# Patient Record
Sex: Female | Born: 1960 | Race: White | Hispanic: No | Marital: Married | Smoking: Never smoker
Health system: Southern US, Community
[De-identification: ages and names within clinical notes are randomized; demographics above are authoritative.]

## PROBLEM LIST (undated history)

## (undated) DIAGNOSIS — J45909 Unspecified asthma, uncomplicated: Secondary | ICD-10-CM

## (undated) DIAGNOSIS — E559 Vitamin D deficiency, unspecified: Secondary | ICD-10-CM

## (undated) DIAGNOSIS — J449 Chronic obstructive pulmonary disease, unspecified: Secondary | ICD-10-CM

## (undated) DIAGNOSIS — C50919 Malignant neoplasm of unspecified site of unspecified female breast: Secondary | ICD-10-CM

## (undated) HISTORY — DX: Malignant neoplasm of unspecified site of unspecified female breast: C50.919

## (undated) HISTORY — DX: Vitamin D deficiency, unspecified: E55.9

## (undated) HISTORY — DX: Chronic obstructive pulmonary disease, unspecified: J44.9

---

## 2003-11-13 ENCOUNTER — Inpatient Hospital Stay (HOSPITAL_COMMUNITY): Admission: EM | Admit: 2003-11-13 | Discharge: 2003-11-14 | Payer: Self-pay | Admitting: Emergency Medicine

## 2004-04-26 ENCOUNTER — Ambulatory Visit: Payer: Self-pay | Admitting: Internal Medicine

## 2004-07-18 ENCOUNTER — Ambulatory Visit: Payer: Self-pay | Admitting: Internal Medicine

## 2004-08-02 ENCOUNTER — Ambulatory Visit: Payer: Self-pay | Admitting: Internal Medicine

## 2004-08-17 ENCOUNTER — Ambulatory Visit: Payer: Self-pay | Admitting: Internal Medicine

## 2004-08-24 ENCOUNTER — Ambulatory Visit: Payer: Self-pay

## 2004-09-20 ENCOUNTER — Ambulatory Visit: Payer: Self-pay | Admitting: Internal Medicine

## 2004-09-23 ENCOUNTER — Ambulatory Visit: Payer: Self-pay | Admitting: Internal Medicine

## 2005-01-03 HISTORY — PX: ABDOMINAL HYSTERECTOMY: SHX81

## 2005-01-20 ENCOUNTER — Ambulatory Visit: Payer: Self-pay | Admitting: Internal Medicine

## 2005-01-31 ENCOUNTER — Inpatient Hospital Stay (HOSPITAL_COMMUNITY): Admission: RE | Admit: 2005-01-31 | Discharge: 2005-02-02 | Payer: Self-pay | Admitting: Obstetrics and Gynecology

## 2005-05-08 ENCOUNTER — Ambulatory Visit: Payer: Self-pay | Admitting: Internal Medicine

## 2006-06-05 DIAGNOSIS — C50919 Malignant neoplasm of unspecified site of unspecified female breast: Secondary | ICD-10-CM

## 2006-06-05 HISTORY — DX: Malignant neoplasm of unspecified site of unspecified female breast: C50.919

## 2008-11-03 HISTORY — PX: LAPAROSCOPIC GASTRIC BANDING: SHX1100

## 2018-02-22 ENCOUNTER — Telehealth: Payer: Self-pay | Admitting: Oncology

## 2018-02-22 ENCOUNTER — Encounter: Payer: Self-pay | Admitting: Oncology

## 2018-02-22 NOTE — Telephone Encounter (Signed)
Received the pt's records from [facility] to transfer her care for hx of breas cancer. Pt has been cld and scheduled to see Dr. Jana Hakim on 10/21 at 4pm. Pt aware to arrive 30 minutes early. Letter mailed.

## 2018-03-21 ENCOUNTER — Other Ambulatory Visit: Payer: Self-pay | Admitting: General Surgery

## 2018-03-21 DIAGNOSIS — R1110 Vomiting, unspecified: Secondary | ICD-10-CM

## 2018-03-25 ENCOUNTER — Other Ambulatory Visit: Payer: Self-pay | Admitting: *Deleted

## 2018-03-25 ENCOUNTER — Ambulatory Visit
Admission: RE | Admit: 2018-03-25 | Discharge: 2018-03-25 | Disposition: A | Discharge status: Home / Self Care | Payer: BLUE CROSS/BLUE SHIELD | Source: Ambulatory Visit | Attending: General Surgery | Admitting: General Surgery

## 2018-03-25 ENCOUNTER — Encounter: Payer: Self-pay | Admitting: Oncology

## 2018-03-25 ENCOUNTER — Inpatient Hospital Stay: Payer: BLUE CROSS/BLUE SHIELD | Attending: Oncology | Admitting: Oncology

## 2018-03-25 DIAGNOSIS — H04202 Unspecified epiphora, left lacrimal gland: Secondary | ICD-10-CM | POA: Insufficient documentation

## 2018-03-25 DIAGNOSIS — Z170 Estrogen receptor positive status [ER+]: Secondary | ICD-10-CM

## 2018-03-25 DIAGNOSIS — Z86000 Personal history of in-situ neoplasm of breast: Secondary | ICD-10-CM | POA: Insufficient documentation

## 2018-03-25 DIAGNOSIS — Z808 Family history of malignant neoplasm of other organs or systems: Secondary | ICD-10-CM | POA: Insufficient documentation

## 2018-03-25 DIAGNOSIS — R1110 Vomiting, unspecified: Secondary | ICD-10-CM

## 2018-03-25 DIAGNOSIS — C4492 Squamous cell carcinoma of skin, unspecified: Secondary | ICD-10-CM

## 2018-03-25 DIAGNOSIS — R203 Hyperesthesia: Secondary | ICD-10-CM

## 2018-03-25 DIAGNOSIS — L989 Disorder of the skin and subcutaneous tissue, unspecified: Secondary | ICD-10-CM | POA: Insufficient documentation

## 2018-03-25 DIAGNOSIS — Z853 Personal history of malignant neoplasm of breast: Secondary | ICD-10-CM | POA: Insufficient documentation

## 2018-03-25 DIAGNOSIS — H04212 Epiphora due to excess lacrimation, left lacrimal gland: Secondary | ICD-10-CM | POA: Diagnosis not present

## 2018-03-25 DIAGNOSIS — C50811 Malignant neoplasm of overlapping sites of right female breast: Secondary | ICD-10-CM

## 2018-03-25 NOTE — Progress Notes (Signed)
[facility]  [phone] [phone]) [phone]     ID: Gwendolyn Taylor DOB: 02/19/1961  MR#: [id number]  QBH#:[id number]  Patient Care Team: Patient, No Pcp Per as PCP - General ([place]) Gwendolyn Taylor as Consulting Physician (Oncology) OTHER Taylor:  CHIEF COMPLAINT: Estrogen receptor positive breast cancer  CURRENT TREATMENT: Observation  HISTORY OF CURRENT ILLNESS:  The patient had screening breast mammography performed 12-08-2006, showing some suspicious areas in the right breast.  She was set up for core biopsy 11/28/2006.  The pathology [id number]) showed mostly intraductal carcinoma, with comedonecrosis, and an adjacent focus of infiltrating ductal carcinoma.  On 12/06/2006 the patient underwent lumpectomy and sentinel lymph node biopsy.  The pathology from this procedure ([id number]) showed residual intraductal carcinoma, DCIS, with minute foci of infiltrating ductal carcinoma.  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  The margins were clear.  The residual mass was measured at 0.5 cm.  The patient is reportedly estrogen progesterone and HER-2 positive.  She was then treated with dose dense doxorubicin and cyclophosphamide x4, completed 04/20/2007, and received adjuvant radiation completed in January 2009.  Adjuvantly the patient was started on anastrozole, which she did not tolerate because of arthralgias and myalgias, and then switched to letrozole, which she did not tolerate because of again arthralgias and myalgias as well as hot flashes.  She was offered (at the 01/14/2009 visit) tamoxifen or raloxifene, but refused any further antiestrogens as they made her feel very sick. She has been under observation since that time.   The patient's subsequent history is as detailed below.  INTERVAL HISTORY: Gwendolyn Taylor was evaluated in the breast cancer clinic on 03/25/2018. She is here to establish care since moving to [facility] in December 2018.  Her last mammogram was  performed on 08/18/16 at [facility], showing no suspicious mass.   REVIEW OF SYSTEMS: There were no specific symptoms leading to the original mammogram, which was routinely scheduled. She denies fevers, drenching sweats, fatigue. She reports a new spot to her chest that is pink for the last two weeks. She is currently out of vitamin D. The patient denies unusual headaches, visual changes, nausea, vomiting, stiff neck, dizziness, or gait imbalance. There has been no cough, phlegm production, or pleurisy, no chest pain or pressure, and no change in bowel or bladder habits. The patient denies fever, rash, bleeding, unexplained fatigue or unexplained weight loss. A detailed review of systems was otherwise entirely negative.   PAST MEDICAL HISTORY: Past Medical History:  Diagnosis Date  . Breast cancer ([place]) 2008   intraductal carcinoma, DCIS, HER-2 positive  . COPD (chronic obstructive pulmonary disease) ([place])   . Vitamin D deficiency   History of diabetes and HTN-- resolved with lab band placement.  PAST SURGICAL HISTORY: Past Surgical History:  Procedure Laterality Date  . ABDOMINAL HYSTERECTOMY  01/2005   no BSO  . CESAREAN SECTION  1997  . LAPAROSCOPIC GASTRIC BANDING  12-07-08      FAMILY HISTORY Family History  Problem Relation Age of Onset  . Melanoma Mother 21       removal in her 66s  . Alzheimer's disease Mother   . Diabetes Mother   . Heart disease Mother   . Alcohol abuse Father   . Ovarian cancer Neg Hx   . Breast cancer Neg Hx     She notes that her mother died from Alzheimer's, heart disease, and diabetes in 12/07/17. Patients' father died from suicide with drug and alcohol abuse  at age 67. Her parents divorced when she was 53 y.o. The patient has 0 brothers and 1 sister. Patient denies anyone in her family having ovarian or breast cancer. Her mother had melanoma removed in her 27s. Her first husband died from colon cancer.   GYNECOLOGIC HISTORY:  No LMP  recorded. Menarche: 57 years old Age at first live birth: 57 years old [place] P 1 LMP 2005 Contraceptive: yes, no problems HRT none  Hysterectomy? Yes, partial (ovaries in situ), 2006 She took infertility medication  At the time of her first marriage (son is from 2d marriage)   SOCIAL HISTORY: [place]'s from [place] and moved back to [place] in November 2018. She is a Insurance underwriter. Her husband, Liliane Channel, is a catholic priest who works at a funeral home. Son Edison Nasuti, age 64, studies music at [employer] and hopes to go to [place] following undergraduate. He is also minoring in business.They currently attend [employer], where her husband plays the organ.      ADVANCED DIRECTIVES: n/a   HEALTH MAINTENANCE: Social History   Tobacco Use  . Smoking status: Not on file  Substance Use Topics  . Alcohol use: Not on file  . Drug use: Not on file     Colonoscopy: 08/03/2016  PAP: 07/28/2014  Bone density: 07/29/2014   No Known Allergies  Current Outpatient Medications  Medication Sig Dispense Refill  . albuterol (PROVENTIL HFA;VENTOLIN HFA) 108 (90 Base) MCG/ACT inhaler Inhale into the lungs every 6 (six) hours as needed for wheezing or shortness of breath.    . loratadine (CLARITIN) 10 MG tablet Claritin 10 mg tablet  Take 1 tablet every day by oral route.     No current facility-administered medications for this visit.     OBJECTIVE: Middle-aged white woman in no acute distress  Vitals:   03/25/18 1554  BP: (!) 140/95  Pulse: 83  Resp: 19  Temp: 98.5 F (36.9 C)  SpO2: 99%     Body mass index is 31.73 kg/m.   Wt Readings from Last 3 Encounters:  03/25/18 179 lb 1.6 oz (81.2 kg)      ECOG FS:1 - Symptomatic but completely ambulatory  Ocular: Sclerae unicteric, pupils round and equal; no apparent epiphora on left Ear-nose-throat: Oropharynx clear and moist Lymphatic: No cervical or supraclavicular adenopathy Lungs no  rales or rhonchi Heart regular rate and rhythm Abd soft, nontender, positive bowel sounds MSK no focal spinal tenderness, no joint edema Neuro: non-focal, well-oriented, appropriate affect Breasts: The right breast exhibits significant hyperesthesia, without erythema.  It is difficult to examine and even a minimal touch is extremely uncomfortable for the patient and "feels weird".  However I did examine the breast and there are no palpable masses.  The left breast is unremarkable.  Both axillae are benign. Skin: The area of concern in the middle anterior chest is a slightly scaly lesion measuring approximately 2 mm, not erythematous or tender.  The other lesion that she dislikes in the right lower quadrant is a seborrheic keratosis   LAB RESULTS:  CMP  No results found for: NA, K, CL, CO2, GLUCOSE, BUN, CREATININE, CALCIUM, PROT, ALBUMIN, AST, ALT, ALKPHOS, BILITOT, GFRNONAA, GFRAA  No results found for: TOTALPROTELP, ALBUMINELP, A1GS, A2GS, BETS, BETA2SER, GAMS, MSPIKE, SPEI  No results found for: KPAFRELGTCHN, LAMBDASER, KAPLAMBRATIO  No results found for: WBC, NEUTROABS, HGB, HCT, MCV, PLT  _0 @  No results found for: LABCA2  No components  found for: BZJIRC789  No results for input(s): INR in the last 168 hours.  No results found for: LABCA2  No results found for: FYB017  No results found for: PZW258  No results found for: NID782  No results found for: CA2729  No components found for: HGQUANT  No results found for: CEA1 / No results found for: CEA1   No results found for: AFPTUMOR  No results found for: CHROMOGRNA  No results found for: PSA1  No visits with results within 3 Day(s) from this visit.  Latest known visit with results is:  No results found for any previous visit.    (this displays the last labs from the last 3 days)  No results found for: TOTALPROTELP, ALBUMINELP, A1GS, A2GS, BETS, BETA2SER, GAMS, MSPIKE, SPEI (this displays SPEP  labs)  No results found for: KPAFRELGTCHN, LAMBDASER, KAPLAMBRATIO (kappa/lambda light chains)  No results found for: HGBA, HGBA2QUANT, HGBFQUANT, HGBSQUAN (Hemoglobinopathy evaluation)   No results found for: LDH  No results found for: IRON, TIBC, IRONPCTSAT (Iron and TIBC)  No results found for: FERRITIN  Urinalysis No results found for: COLORURINE, APPEARANCEUR, LABSPEC, PHURINE, GLUCOSEU, HGBUR, BILIRUBINUR, KETONESUR, PROTEINUR, UROBILINOGEN, NITRITE, LEUKOCYTESUR   STUDIES: Outside studies available today include bilateral screening 3D mammography with tomography at [facility] 08/18/2016 showing no suspicious mass, scattered fibroglandular densities.  Bone scan 12/26/2010 showed no evidence of metastatic disease.  There was arthritis involving the shoulders wrists hips knees and ankles as well as degenerative disease of the spine.  On 08/07/2007 she had CT scans of the chest abdomen and pelvis with contrast showing pleural and parenchymal scarring in the right mid lungs, related to radiation changes; there were small bilateral axillary lymph nodes, a 0.2 cm right lower lobe nodular density, a few small periaortic lymph nodes, a 0.2 cm lytic lesion in the left pelvis felt to be an ovarian cyst and essentially no change from prior study 04/17/2007.  ELIGIBLE FOR AVAILABLE RESEARCH PROTOCOL: no  ASSESSMENT: 57 y.o. [place], [place] woman status post right breast overlapping biopsy June 2008 for an invasive ductal carcinoma, grade not stated, estrogen and progesterone receptor positive, HER-2 amplified.  (1) status post left lumpectomy and sentinel lymph node sampling for a pT1a pN0, stage IA invasive ductal carcinoma, with negative margins  (2) treated adjuvantly with cyclophosphamide and doxorubicin in dose dense fashion x4, completed 04/20/2007  (3) status post adjuvant radiation completed January 2009  (4) antiestrogen therapy:  (a) did not tolerate anastrozole or  letrozole; received less of 1 year of aromatase inhibitors  (b) decided against tamoxifen or raloxifene or any other antiestrogens as of 01/14/2009  PLAN: I spent approximately 50 minutes face to face with Kiri with more than 50% of that time spent in counseling and coordination of care. Specifically we discussed her case as if it had just been diagnosed so that she could see how we would have approached her situation in 2019 as opposed to 10 years ago.  We first reviewed the fact that cancer is not one disease but more than 100 different diseases and that it is important to keep them separate-- otherwise when friends and relatives discuss their own cancer experiences with Montie confusion can result. Similarly we explained that if breast cancer spreads to the bone or liver, the patient would not have bone cancer or liver cancer, but breast cancer in the bone and breast cancer in the liver: one cancer in three places-- not 3 different cancers which otherwise would have to  be treated in 3 different ways.  We discussed the difference between local and systemic therapy. In terms of loco-regional treatment, lumpectomy plus radiation is equivalent to mastectomy as far as survival is concerned. For this reason, and because the cosmetic results are generally superior, we recommend breast conserving surgery followed by radiation.  This is the treatment she had and I note that margins were clear at the time of surgery.  We then discussed the rationale for systemic therapy. There is some risk that this cancer may have already spread to other parts of her body. Patients frequently ask at this point about bone scans, CAT scans and PET scans to find out if they have occult breast cancer somewhere else. The problem is that in early stage disease we are much more likely to find false positives then true cancers and this would expose the patient to unnecessary procedures as well as unnecessary radiation.  In fact she  did have scans initially which showed nonspecific adenopathy.  This was followed up by a PET scan and then by repeat series of CT scans.  10 years later she has had no "B symptoms" or any other suggestion of possible lymphoma.  Those scans do not need to be repeated in the absence of any specific symptoms to evaluate.Marland Kitchen  Next we went over the options for systemic therapy which are anti-estrogens, anti-HER-2 immunotherapy, and chemotherapy. [place] would be a candidate for all 3, except that currently NCCN guidelines do not recommend either chemotherapy or anti-HER-2 treatment for T1a disease.  Antiestrogens are to be "considered".  We discussed antiestrogens.  She has tried aromatase inhibitors and did not tolerate them.  She was offered tamoxifen or raloxifene but chose not to take them.  I reviewed with her that these are very different from aromatase inhibitors and she might well tolerate these without difficulty.    In short she is very likely cured of both the invasive and noninvasive component of her 2009 cancers.  The main difference between the treatment she received and today is that we would not have given her chemotherapy.  We reviewed the fact that the strange sensations in her right breast are not "in her head".  This is due to nerve damage from radiation and it is called hyperesthesia.  It is not likely to go away.  It may be that with different bras it might be a little bit more manageable and she will go to "second to nature" to explore that possibility.  The left epiphora is not due to adenopathy.  It is due to a blocked duct.  We are placing a referral to [facility] ophthalmology to see if they can help her manage that.  She has a family history of melanoma and a spot in the front chest that she would like evaluated as well as one in the right lower quadrant that she would like removed.  We have placed a referral to [facility] dermatology to see if they can see her on a yearly basis for  screening  Otherwise I think she is an excellent candidate for our survivorship program.  She is going to establish herself with a gynecologist and get a mammogram through that office.  I think that will be done before the end of the year.  She will see my nurse practitioner in survivorship April 2020.  I will be glad to see Judeen Hammans at any point in the future if and when the need arises but as of now are making no  routine appointments for her with me in the future After much discussion   Gwendolyn Taylor  03/25/18 5:03 PM Medical Oncology and Hematology [facility] [address] [place], [place] [address] Tel. [phone]    Fax. [phone]  IWilburn Mylar, am acting as scribe for Chauncey Cruel Taylor.  I, Lurline Del Taylor, have reviewed the above documentation for accuracy and completeness, and I agree with the above.

## 2018-03-26 ENCOUNTER — Encounter: Payer: Self-pay | Admitting: Oncology

## 2018-03-26 ENCOUNTER — Telehealth: Payer: Self-pay | Admitting: Oncology

## 2018-03-26 NOTE — Telephone Encounter (Signed)
Per 10/21 no los °

## 2018-03-27 ENCOUNTER — Telehealth: Payer: Self-pay | Admitting: Adult Health

## 2018-03-27 NOTE — Telephone Encounter (Signed)
Scheduled appt per 10/21 sch message SCP - sent reminder letter in the mail with f/u date and time.

## 2018-09-10 ENCOUNTER — Telehealth: Payer: Self-pay | Admitting: Adult Health

## 2018-09-10 NOTE — Telephone Encounter (Signed)
Rescheduled SCP for LTS per sch msg. Called patient. No answer, left msg, mailed printout.

## 2018-09-13 ENCOUNTER — Other Ambulatory Visit: Payer: Self-pay

## 2018-09-13 ENCOUNTER — Encounter (HOSPITAL_COMMUNITY): Payer: Self-pay | Admitting: Emergency Medicine

## 2018-09-13 ENCOUNTER — Emergency Department (HOSPITAL_COMMUNITY): Payer: BLUE CROSS/BLUE SHIELD

## 2018-09-13 ENCOUNTER — Emergency Department (HOSPITAL_COMMUNITY)
Admission: EM | Admit: 2018-09-13 | Discharge: 2018-09-13 | Disposition: A | Discharge status: Home / Self Care | Payer: BLUE CROSS/BLUE SHIELD | Attending: Emergency Medicine | Admitting: Emergency Medicine

## 2018-09-13 DIAGNOSIS — J45909 Unspecified asthma, uncomplicated: Secondary | ICD-10-CM | POA: Diagnosis not present

## 2018-09-13 DIAGNOSIS — Z85828 Personal history of other malignant neoplasm of skin: Secondary | ICD-10-CM | POA: Insufficient documentation

## 2018-09-13 DIAGNOSIS — Z853 Personal history of malignant neoplasm of breast: Secondary | ICD-10-CM | POA: Insufficient documentation

## 2018-09-13 DIAGNOSIS — Z79899 Other long term (current) drug therapy: Secondary | ICD-10-CM | POA: Insufficient documentation

## 2018-09-13 DIAGNOSIS — J449 Chronic obstructive pulmonary disease, unspecified: Secondary | ICD-10-CM | POA: Insufficient documentation

## 2018-09-13 DIAGNOSIS — M25562 Pain in left knee: Secondary | ICD-10-CM | POA: Diagnosis not present

## 2018-09-13 HISTORY — DX: Unspecified asthma, uncomplicated: J45.909

## 2018-09-13 NOTE — ED Triage Notes (Signed)
Getting out of shower last night and heard pop Now unable to walk on it   Swollen, blue

## 2018-09-13 NOTE — ED Provider Notes (Signed)
[facility] EMERGENCY DEPARTMENT Provider Note   CSN: [id number] Arrival date & time: 09/13/18  1312    History   Chief Complaint Chief Complaint  Patient presents with  . Knee Pain    L    HPI ODESSIA ASLESON is a 58 y.o. female.     Pop in left knee after getting out of shower last night.  No other injuries.  Patient is having trouble bearing weight and walking on her leg at this time.  Severity is moderate to severe.  Palpation position make pain worse.     Past Medical History:  Diagnosis Date  . Asthma   . Breast cancer ([place]) 2008   intraductal carcinoma, DCIS, HER-2 positive  . COPD (chronic obstructive pulmonary disease) ([place])   . Vitamin D deficiency     Patient Active Problem List   Diagnosis Date Noted  . Malignant neoplasm of overlapping sites of right breast in female, estrogen receptor positive ([place]) 03/25/2018  . 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 Squamous cell skin cancer 03/25/2018  . Hyperesthesia 03/25/2018    Past Surgical History:  Procedure Laterality Date  . ABDOMINAL HYSTERECTOMY  01/2005   no BSO  . CESAREAN SECTION  1997  . LAPAROSCOPIC GASTRIC BANDING  11/2008     OB History   No obstetric history on file.      Home Medications    Prior to Admission medications   Medication Sig Start Date End Date Taking? Authorizing Provider  albuterol (PROVENTIL HFA;VENTOLIN HFA) 108 (90 Base) MCG/ACT inhaler Inhale into the lungs every 6 (six) hours as needed for wheezing or shortness of breath.    [provider]  loratadine (CLARITIN) 10 MG tablet Claritin 10 mg tablet  Take 1 tablet every day by oral route.    [provider]    Family History Family History  Problem Relation Age of Onset  . Melanoma Mother 48       removal in her 53s  . Alzheimer's disease Mother   . Diabetes Mother   . Heart disease Mother   . Alcohol abuse Father   . Ovarian cancer Neg Hx   . Breast cancer Neg Hx      Social History Social History   Tobacco Use  . Smoking status: Never Smoker  . Smokeless tobacco: Never Used  Substance Use Topics  . Alcohol use: Not Currently  . Drug use: Never     Allergies   Patient has no known allergies.   Review of Systems Review of Systems  All other systems reviewed and are negative.    Physical Exam Updated Vital Signs BP 129/80   Pulse 84   Temp 98.1 F (36.7 C) (Oral)   Resp 16   Ht _0  (1.6 m)   Wt 85.7 kg   SpO2 100%   BMI 33.48 kg/m   Physical Exam Vitals signs and nursing note reviewed.  Constitutional:      Appearance: She is well-developed.  HENT:     Head: Normocephalic and atraumatic.  Eyes:     Conjunctiva/sclera: Conjunctivae normal.  Neck:     Musculoskeletal: Neck supple.  Pulmonary:     Effort: Pulmonary effort is normal.  Musculoskeletal:     Comments: Left knee: Edematous.  Pain with range of motion in all quadrants.  Skin:    General: Skin is warm and dry.  Neurological:     Mental Status: She is alert and oriented to person,  place, and time.  Psychiatric:        Behavior: Behavior normal.      ED Treatments / Results  Labs (all labs ordered are listed, but only abnormal results are displayed) Labs Reviewed - No data to display  EKG None  Radiology Dg Knee Complete 4 Views Left  Result Date: 09/13/2018 CLINICAL DATA:  Heard pop. EXAM: LEFT KNEE - COMPLETE 4+ VIEW COMPARISON:  None. FINDINGS: Degenerative changes in the left knee with joint space narrowing and spurring, most pronounced in the patellofemoral compartment. Small joint effusion. No acute bony abnormality. Specifically, no fracture, subluxation, or dislocation. IMPRESSION: Moderate osteoarthritis. Small joint effusion. No acute bony abnormality. Electronically Signed   By: Rolm Baptise M.D.   On: 09/13/2018 13:57    Procedures Procedures (including critical care time)  Medications Ordered in ED Medications - No data to display    Initial Impression / Assessment and Plan / ED Course  I have reviewed the triage vital signs and the nursing notes.  Pertinent labs & imaging results that were available during my care of the patient were reviewed by me and considered in my medical decision making (see chart for details).        Patient presents with pain in left knee acutely last night.  Plain films reveal osteoarthritis and a small effusion.  Suspect ligamentous strain versus meniscus tear.  RICE.  Refer to orthopedics.  Final Clinical Impressions(s) / ED Diagnoses   Final diagnoses:  Acute pain of left knee    ED Discharge Orders    None       Nat Christen, MD 09/13/18 1451

## 2018-09-13 NOTE — Discharge Instructions (Addendum)
Ice, elevate, knee immobilizer.  Over-the-counter pain medication.  Follow-up with orthopedic doctor.  Phone number given.

## 2018-09-26 ENCOUNTER — Encounter: Payer: BLUE CROSS/BLUE SHIELD | Admitting: Adult Health

## 2019-02-07 ENCOUNTER — Encounter: Payer: BLUE CROSS/BLUE SHIELD | Admitting: Adult Health

## 2020-09-21 IMAGING — DX LEFT KNEE - COMPLETE 4+ VIEW
4 series · 4 of 4 positions shown · non-contrast
Comparison: None.

CLINICAL DATA: Heard pop.

EXAM:
LEFT KNEE - COMPLETE 4+ VIEW

[knee ap]
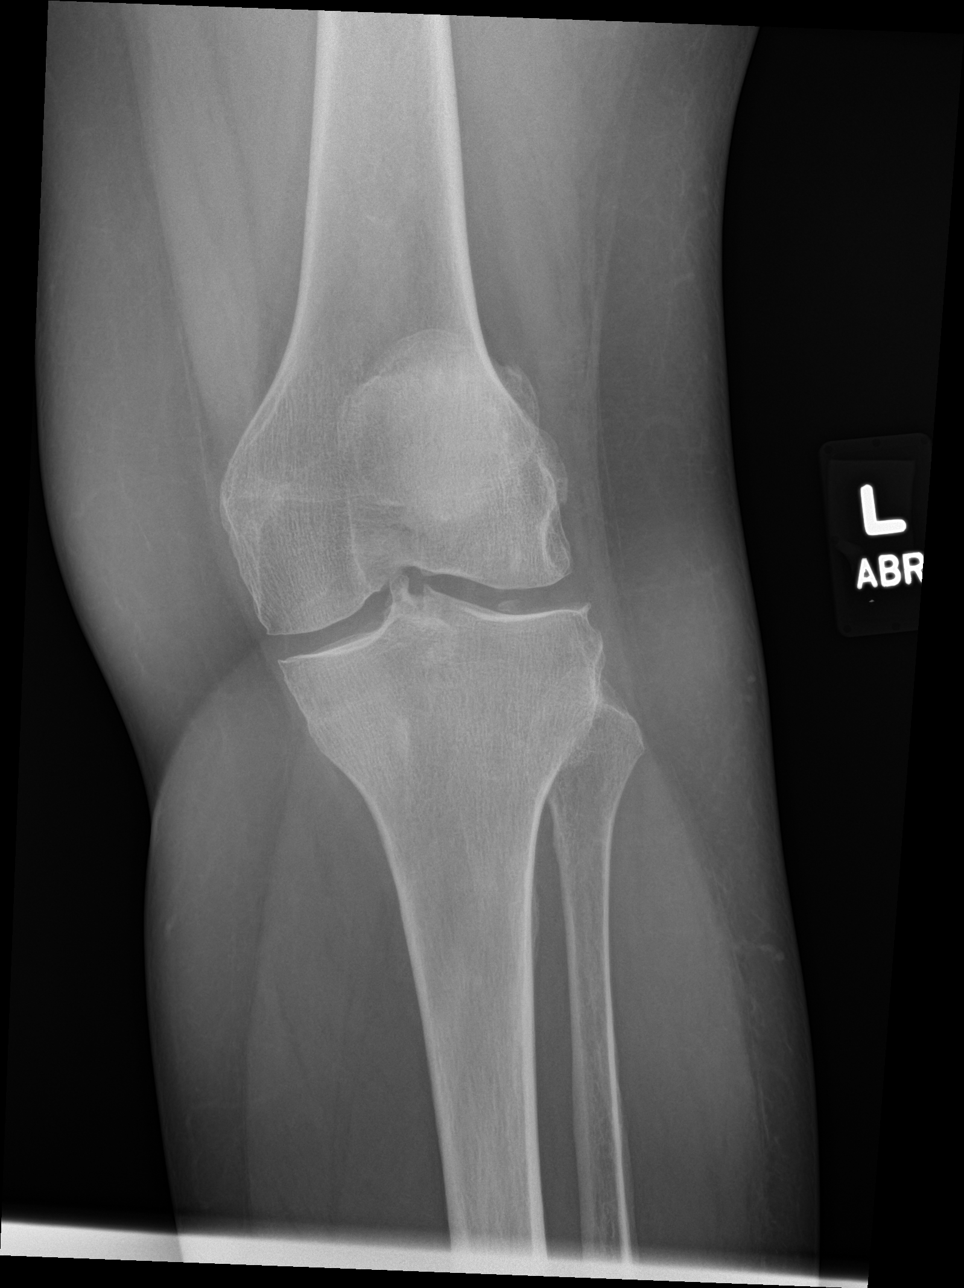

[knee obl (1 of 2)]
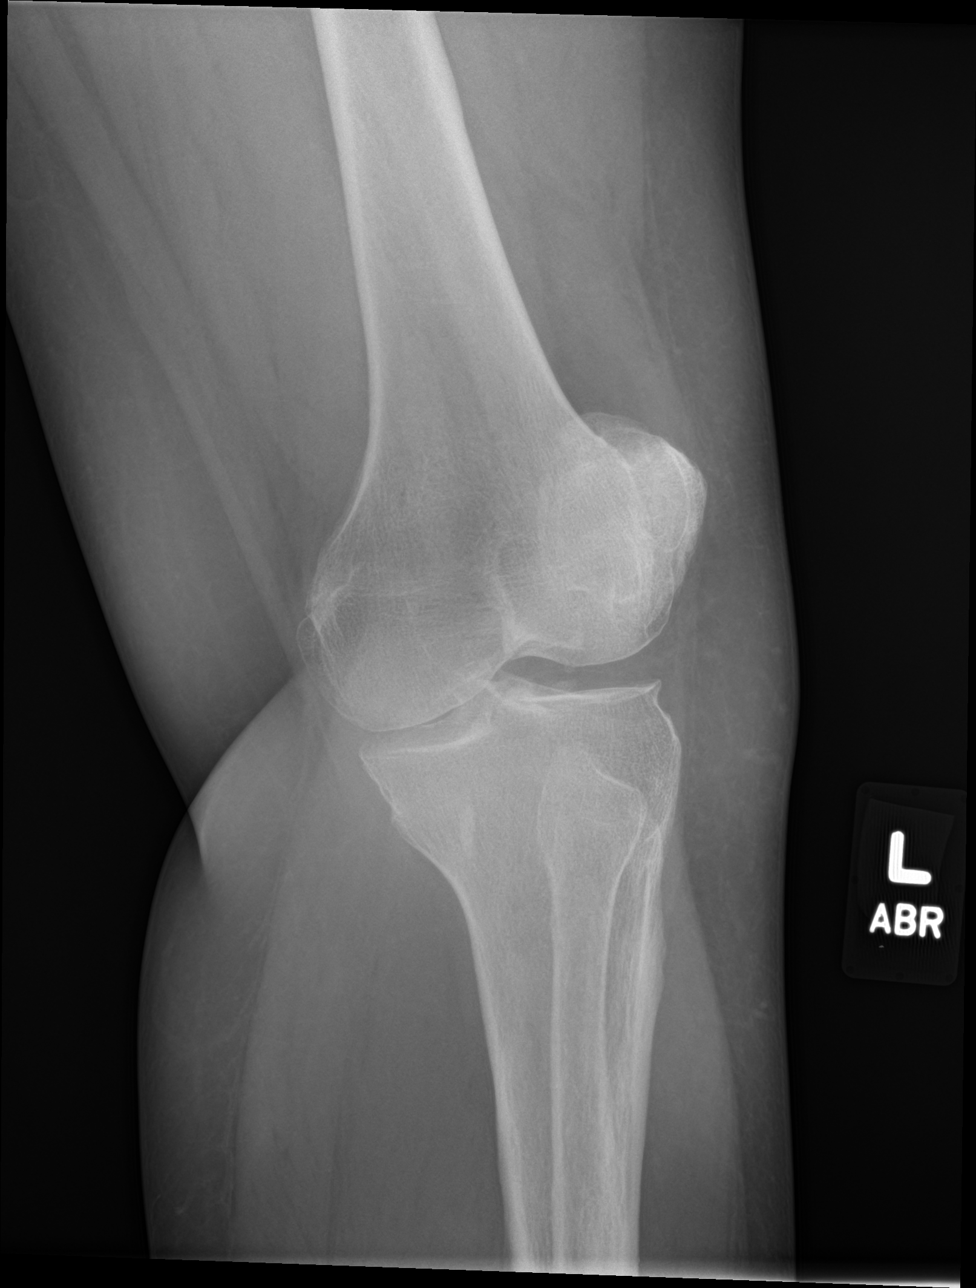

[knee obl (2 of 2)]
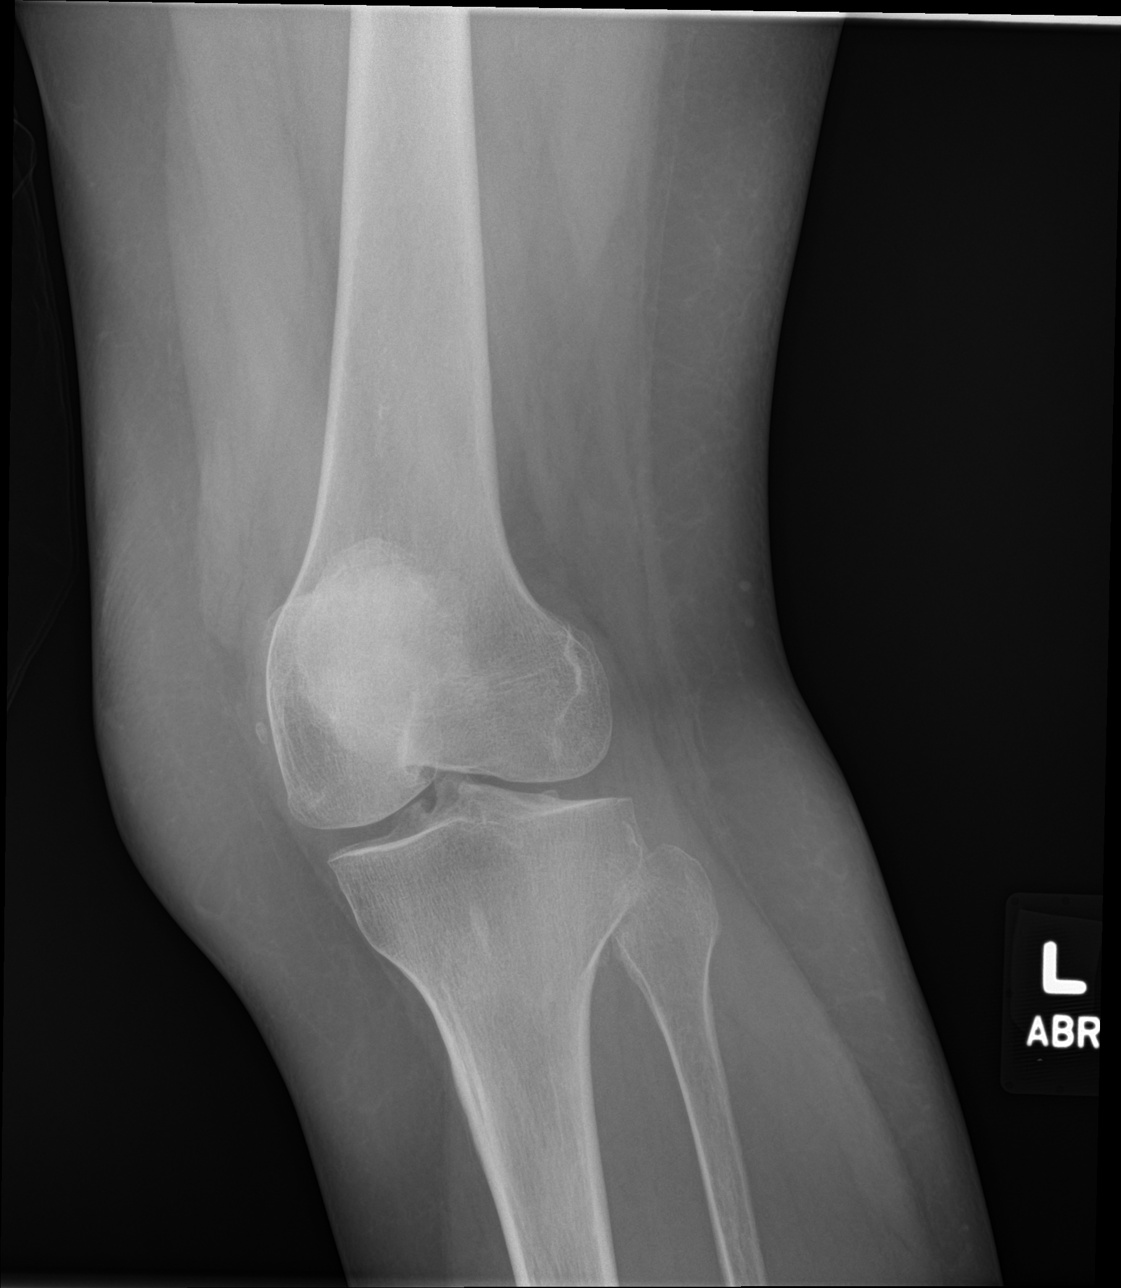

[knee lat]
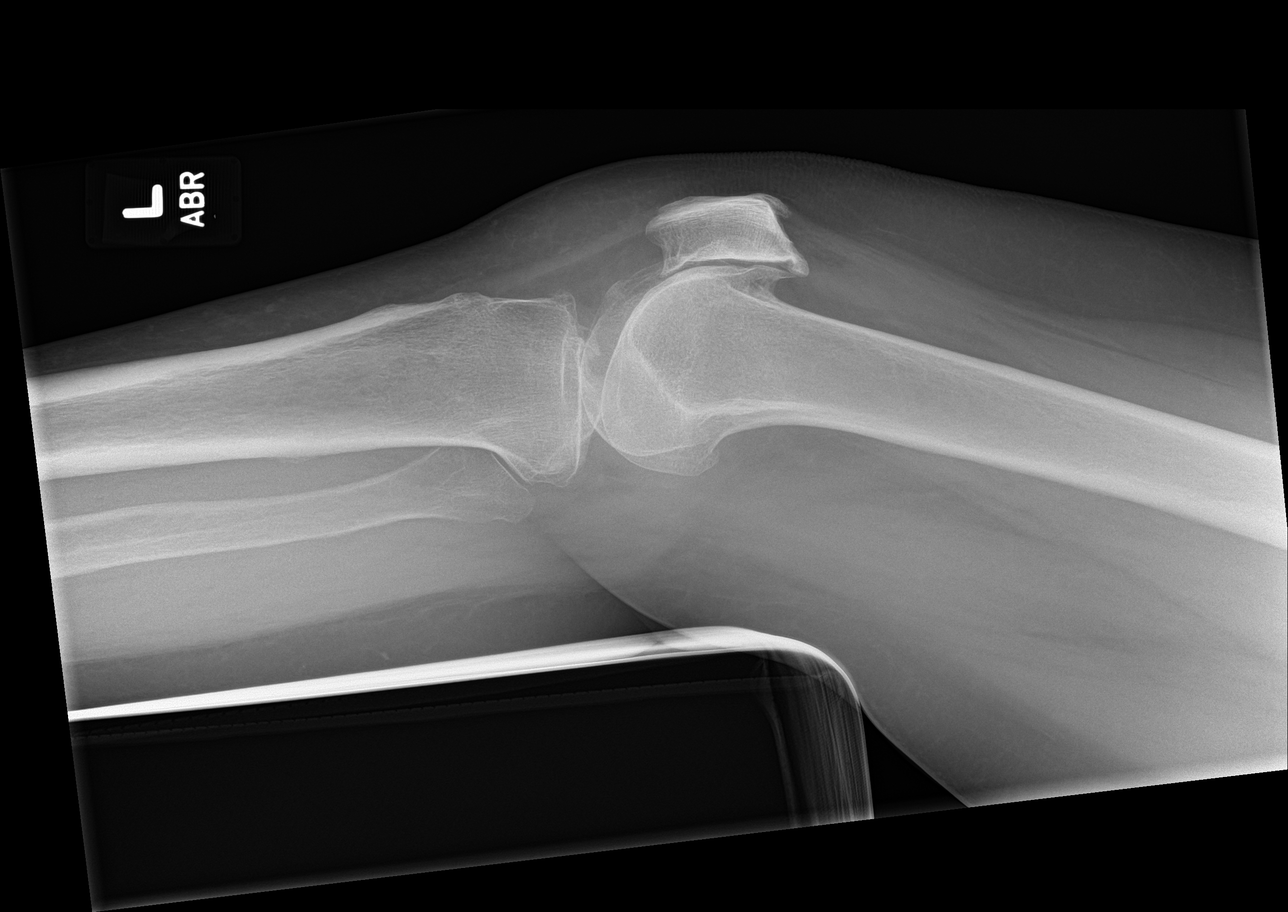

[4 of 4 positions shown; findings below may reference images not displayed]

FINDINGS: Degenerative changes in the left knee with joint space narrowing and
spurring, most pronounced in the patellofemoral compartment. Small
joint effusion. No acute bony abnormality. Specifically, no
fracture, subluxation, or dislocation.
IMPRESSION: Moderate osteoarthritis. Small joint effusion. No acute bony
abnormality.
